# Patient Record
Sex: Male | Born: 1995 | Race: Black or African American | Hispanic: No | Marital: Single | Smoking: Never smoker
Health system: Southern US, Community
[De-identification: ages and names within clinical notes are randomized; demographics above are authoritative.]

## PROBLEM LIST (undated history)

## (undated) ENCOUNTER — Emergency Department (HOSPITAL_COMMUNITY): Discharge status: Absent without leave | Payer: Self-pay

---
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
## 2016-08-28 IMAGING — DX DG ANKLE COMPLETE 3+V*L*
3 series · 3 of 3 positions shown · non-contrast
Comparison: None.

CLINICAL DATA: Left ankle injury yesterday playing basketball,
lateral ankle pain

EXAM:
LEFT ANKLE COMPLETE - 3+ VIEW

[ankle ap]
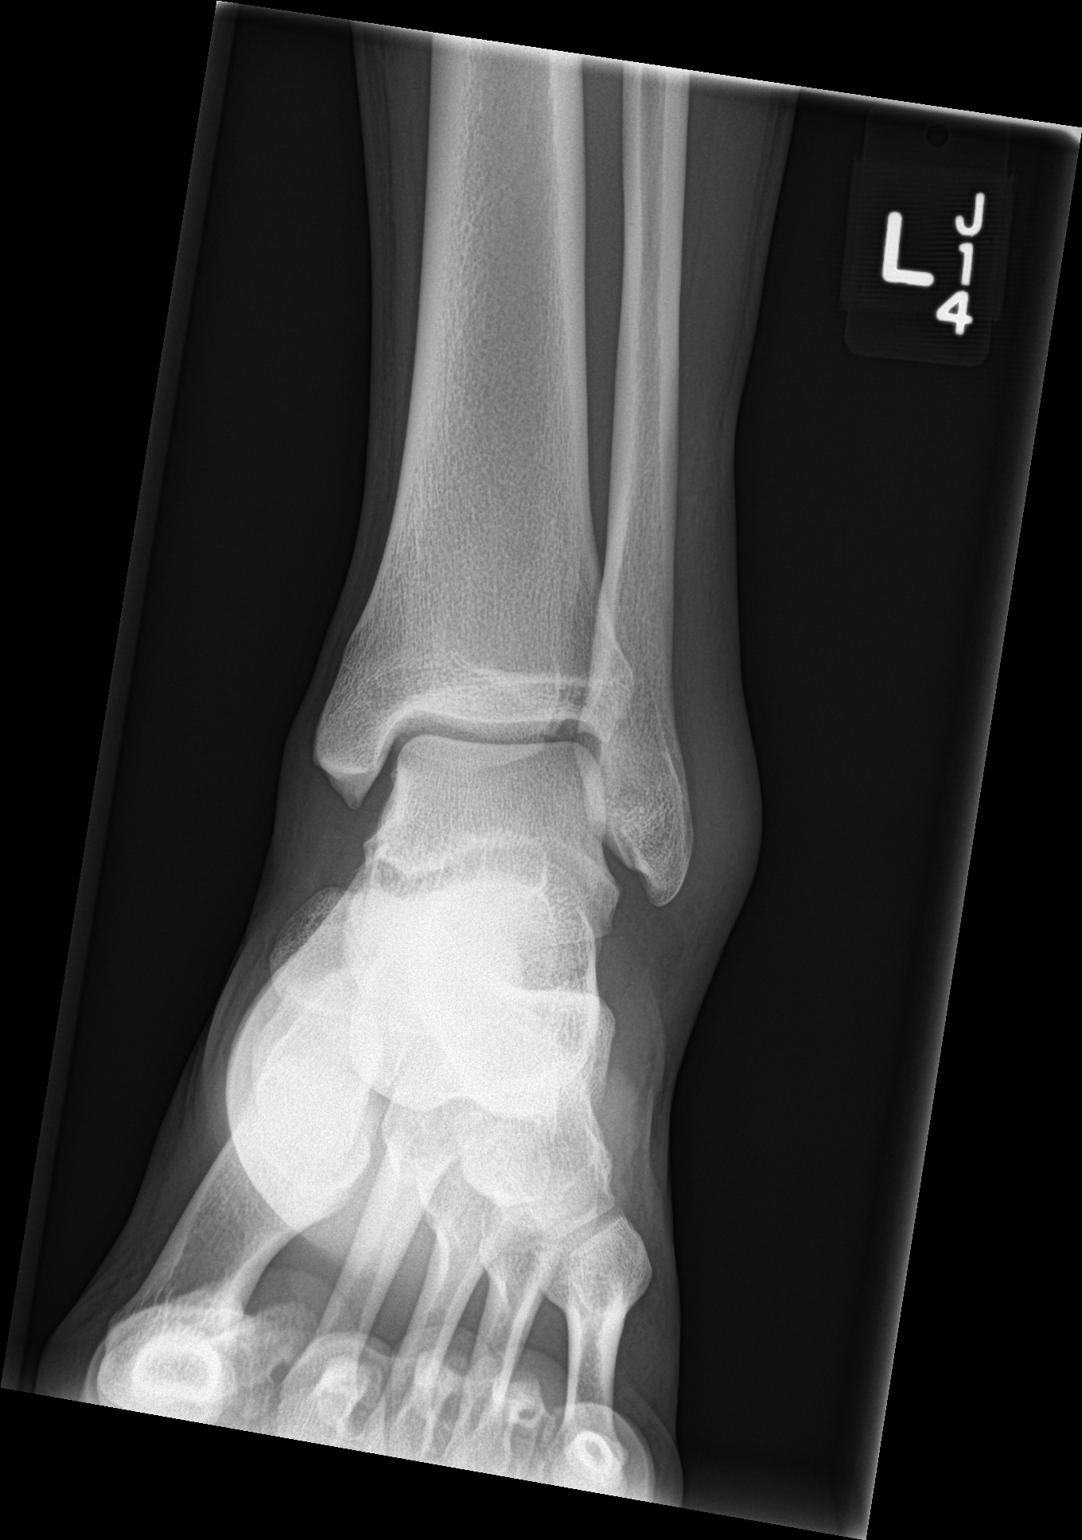

[ankle obl]
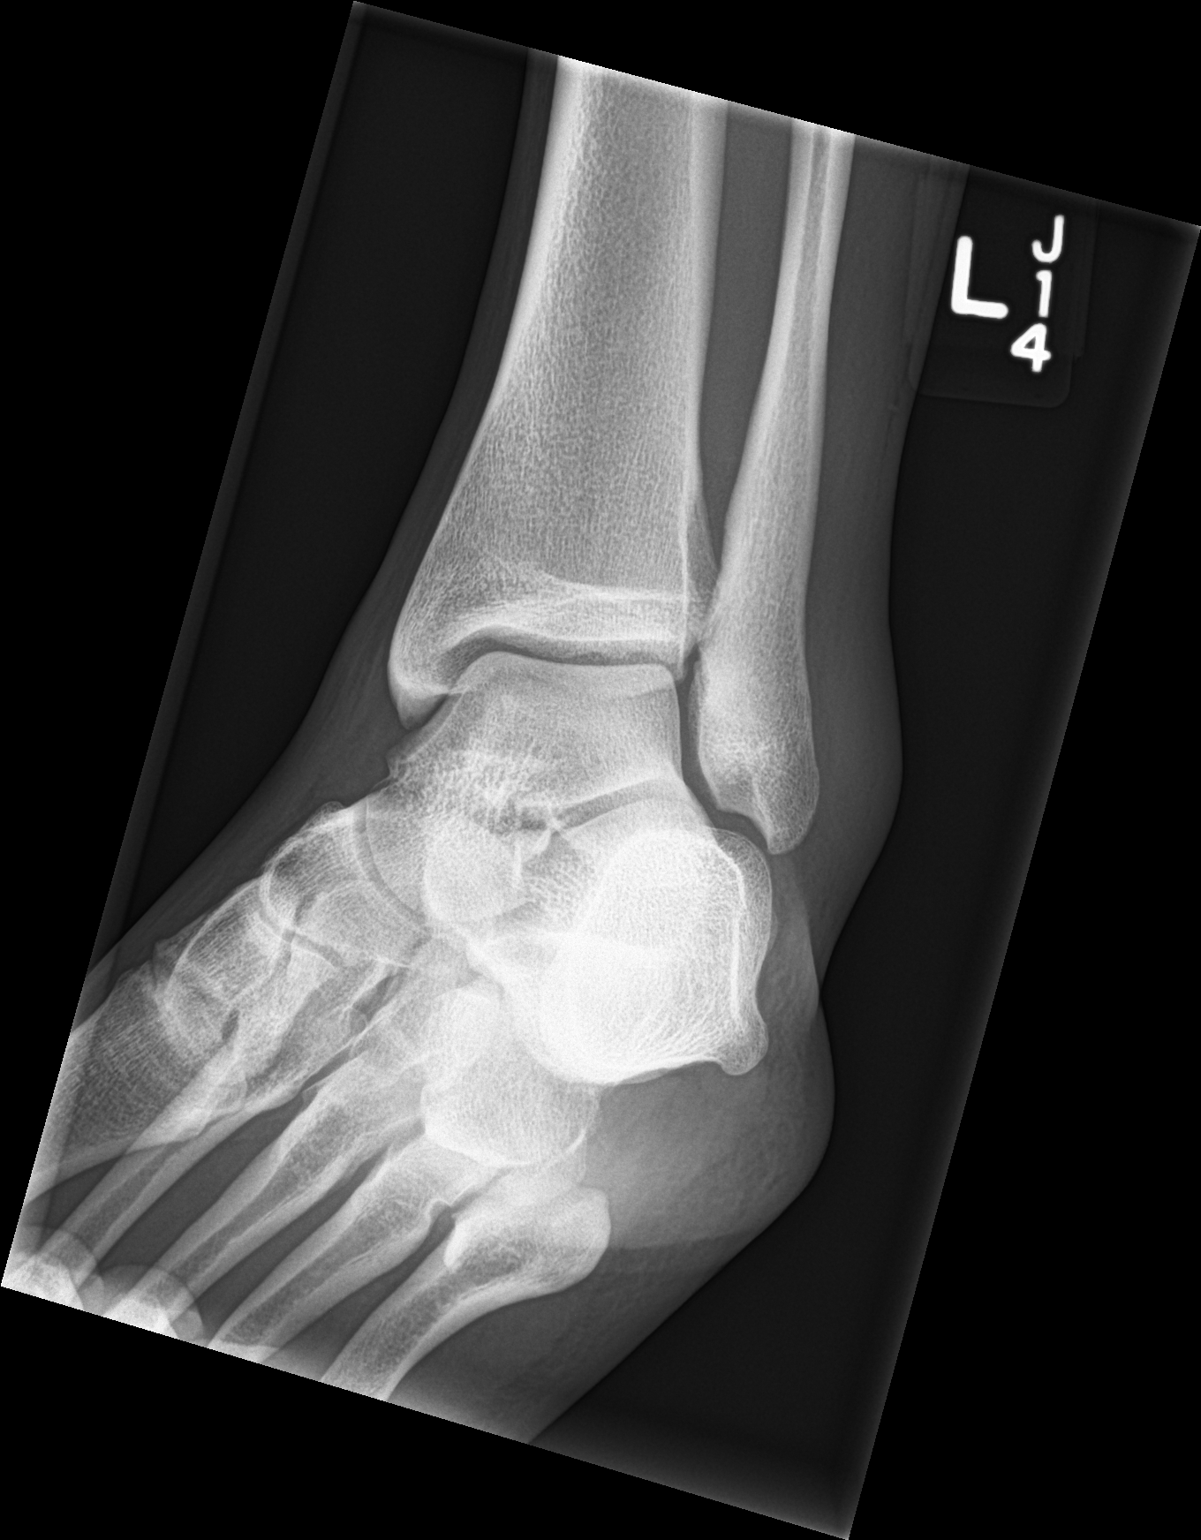

[ankle lat]
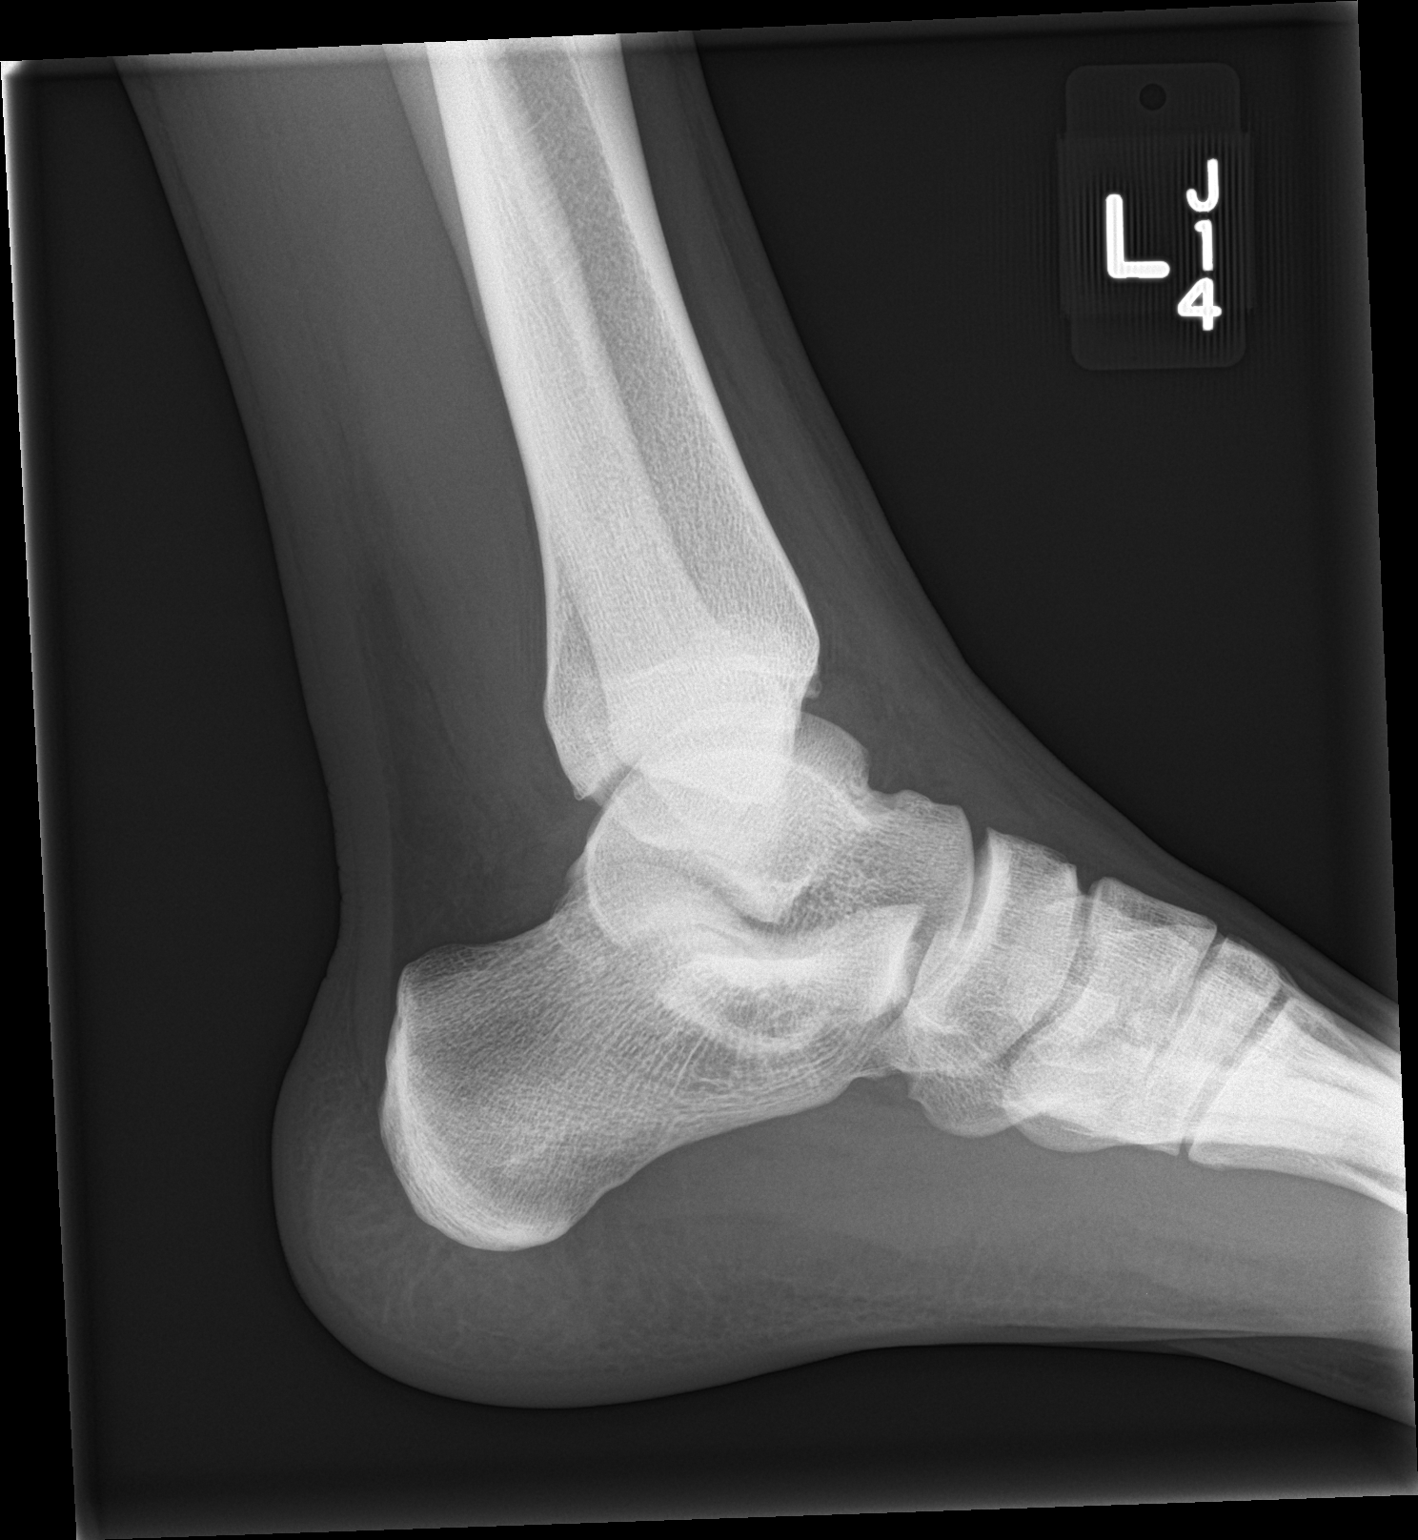

[3 of 3 positions shown; findings below may reference images not displayed]

FINDINGS: Three views of the left ankle submitted. No acute fracture or
subluxation. Ankle mortise is preserved. Soft tissue swelling
adjacent to lateral malleolus.
IMPRESSION: No acute fracture or subluxation.  Lateral soft tissue swelling.

## 2016-09-15 ENCOUNTER — Ambulatory Visit (HOSPITAL_COMMUNITY)
Admission: EM | Admit: 2016-09-15 | Discharge: 2016-09-15 | Disposition: A | Discharge status: Home / Self Care | Payer: Medicaid Other | Attending: Family Medicine | Admitting: Family Medicine

## 2016-09-15 DIAGNOSIS — H1030 Unspecified acute conjunctivitis, unspecified eye: Secondary | ICD-10-CM

## 2016-09-15 DIAGNOSIS — M7581 Other shoulder lesions, right shoulder: Secondary | ICD-10-CM | POA: Diagnosis not present

## 2016-09-15 MED ORDER — POLYMYXIN B-TRIMETHOPRIM 10000-0.1 UNIT/ML-% OP SOLN: 2.0000 [drp] | OPHTHALMIC | 0 refills | Status: AC

## 2016-09-15 MED ORDER — NAPROXEN 500 MG PO TABS: 500.0000 mg | ORAL_TABLET | Freq: Two times a day (BID) | ORAL | 0 refills | Status: DC

## 2016-09-15 NOTE — ED Provider Notes (Signed)
CSN: [id number]     Arrival date & time 09/15/16  1001 History   None    No chief complaint on file.  (Consider location/radiation/quality/duration/timing/severity/associated sxs/prior Treatment) Patient c/o right eye redness and discharge.   The history is provided by the patient.  Conjunctivitis  This is a new problem. The current episode started yesterday. The problem occurs constantly. The problem has not changed since onset.Nothing aggravates the symptoms. Nothing relieves the symptoms. He has tried nothing for the symptoms.    No past medical history on file. No past surgical history on file. No family history on file. Social History  Substance Use Topics  . Smoking status: Not on file  . Smokeless tobacco: Not on file  . Alcohol use No    Review of Systems  Constitutional: Negative.   HENT: Negative.   Eyes: Negative.   Respiratory: Negative.   Cardiovascular: Negative.   Gastrointestinal: Negative.   Endocrine: Negative.   Genitourinary: Negative.   Musculoskeletal: Negative.   Allergic/Immunologic: Negative.   Neurological: Negative.   Hematological: Negative.   Psychiatric/Behavioral: Negative.     Allergies  Patient has no known allergies.  Home Medications   Prior to Admission medications   Medication Sig Start Date End Date Taking? Authorizing Provider  ibuprofen (ADVIL,MOTRIN) 800 MG tablet Take 1 tablet (800 mg total) by mouth 3 (three) times daily. 11/05/14   Linna HoffJames D Kindl, MD  naproxen (NAPROSYN) 500 MG tablet Take 1 tablet (500 mg total) by mouth 2 (two) times daily with a meal. 09/15/16   Deatra CanterWilliam J Shawndell Varas, FNP  trimethoprim-polymyxin b (POLYTRIM) ophthalmic solution Place 2 drops into the right eye every 4 (four) hours. 09/15/16   Deatra CanterWilliam J Aviv Rota, FNP   Meds Ordered and Administered this Visit  Medications - No data to display  BP 137/75 (BP Location: Right Arm)   Pulse (!) 56   Temp 98.3 F (36.8 C) (Oral)   Resp 16   SpO2 100%  No data  found.   Physical Exam  Constitutional: He is oriented to person, place, and time. He appears well-developed and well-nourished.  HENT:  Head: Normocephalic and atraumatic.  Right Ear: External ear normal.  Left Ear: External ear normal.  Mouth/Throat: Oropharynx is clear and moist.  Eyes: Pupils are equal, round, and reactive to light.  Right eye conjunctiva erythematous and swollen  Neck: Normal range of motion. Neck supple.  Cardiovascular: Normal rate, regular rhythm and normal heart sounds.   Pulmonary/Chest: Effort normal and breath sounds normal.  Musculoskeletal: He exhibits tenderness.  Patient with FROM with active ROM right shoulder.  Tenderness with internal and external rotation and abduction.  Neurological: He is alert and oriented to person, place, and time.  Nursing note and vitals reviewed.   Urgent Care Course     Procedures (including critical care time)  Labs Review Labs Reviewed - No data to display  Imaging Review No results found.   Visual Acuity Review  Right Eye Distance:   Left Eye Distance:   Bilateral Distance:    Right Eye Near:   Left Eye Near:    Bilateral Near:         MDM   1. Right rotator cuff tendonitis   2. Acute bacterial conjunctivitis, unspecified laterality    Naprosyn 500mg  one po bid x 10 days Polytrim eye gtt's 2 gtt's OU qid #10 ml     Deatra CanterWilliam J Kassidee Narciso, FNP 09/15/16 1141

## 2016-09-22 ENCOUNTER — Encounter (HOSPITAL_COMMUNITY): Payer: Self-pay | Admitting: Emergency Medicine

## 2016-09-22 ENCOUNTER — Ambulatory Visit (HOSPITAL_COMMUNITY)
Admission: EM | Admit: 2016-09-22 | Discharge: 2016-09-22 | Disposition: A | Discharge status: Home / Self Care | Payer: Medicaid Other | Attending: Internal Medicine | Admitting: Internal Medicine

## 2016-09-22 DIAGNOSIS — H1031 Unspecified acute conjunctivitis, right eye: Secondary | ICD-10-CM

## 2016-09-22 MED ORDER — MOXIFLOXACIN HCL 0.5 % OP SOLN: 1.0000 [drp] | Freq: Three times a day (TID) | OPHTHALMIC | 0 refills | Status: AC

## 2016-09-22 NOTE — ED Provider Notes (Signed)
CSN: [id number]     Arrival date & time 09/22/16  1221 History   First MD Initiated Contact with Patient 09/22/16 1337     Chief Complaint  Patient presents with  . Eye Problem   (Consider location/radiation/quality/duration/timing/severity/associated sxs/prior Treatment) 21 year old male was seen in our urgent care 1 week ago with 2 complaints 1 related to rotator cuff rob him and the other was some redness to the right eye. The right eye was treated with Polytrim eyedrops and he states he has been compliant with that. Thigh is getting worse, the redness is worse more swelling to the lower lid conjunctiva. There is a highly thick honey-colored drainage which tends to crust over from the inner and outer corners of the eye. He denies problems with vision.      History reviewed. No pertinent past medical history. History reviewed. No pertinent surgical history. History reviewed. No pertinent family history. Social History  Substance Use Topics  . Smoking status: Not on file  . Smokeless tobacco: Not on file  . Alcohol use No    Review of Systems  Constitutional: Negative.   HENT: Negative.   Eyes: Positive for discharge, redness and itching. Negative for visual disturbance.  Respiratory: Negative.   Cardiovascular: Negative for chest pain.  All other systems reviewed and are negative.   Allergies  Patient has no known allergies.  Home Medications   Prior to Admission medications   Medication Sig Start Date End Date Taking? Authorizing Provider  trimethoprim-polymyxin b (POLYTRIM) ophthalmic solution Place 2 drops into the right eye every 4 (four) hours. 09/15/16  Yes Deatra Canter, FNP  moxifloxacin (VIGAMOX) 0.5 % ophthalmic solution Place 1 drop into the right eye 3 (three) times daily. X 7 days 09/22/16   Hayden Rasmussen, NP   Meds Ordered and Administered this Visit  Medications - No data to display  BP 145/90 (BP Location: Right Arm)   Pulse (!) 56   Temp 99.5 F (37.5  C) (Oral)   Resp 18   SpO2 97%  No data found.   Physical Exam  Constitutional: He is oriented to person, place, and time. He appears well-developed and well-nourished. No distress.  HENT:  Head: Normocephalic and atraumatic.  Eyes: EOM are normal. Pupils are equal, round, and reactive to light.  Sclera injected. Lower lid conjunctiva deeply erythema this with mild swelling. Anterior chambers clear. No tenderness or swelling of the upper lid. No palpable nodules or pustules. There is a honey colored discharge from the corners of the right. Left eye unaffected.  Neck: Normal range of motion. Neck supple.  Cardiovascular: Normal rate.   Pulmonary/Chest: Effort normal.  Neurological: He is alert and oriented to person, place, and time.  Skin: Skin is warm and dry.  Nursing note and vitals reviewed.   Urgent Care Course     Procedures (including critical care time)  Labs Review Labs Reviewed - No data to display  Imaging Review No results found.   Visual Acuity Review  Right Eye Distance: 20/25 Left Eye Distance: 20/25 Bilateral Distance: 20/20  Right Eye Near:   Left Eye Near:    Bilateral Near:         MDM   1. Acute bacterial conjunctivitis of right eye   Use the drops hysterectomy. Warm compresses as discussed. Wash hands frequently. If not getting better in 3-4 days call the ophthalmologist above for an appointment. Meds ordered this encounter  Medications  . moxifloxacin (VIGAMOX) 0.5 % ophthalmic solution  Sig: Place 1 drop into the right eye 3 (three) times daily. X 7 days    Dispense:  3 mL    Refill:  0    Order Specific Question:   Supervising Provider    Answer:   Lonia BloodLAUENSTEIN, KURT [[id number]]        Hayden Rasmussenavid Lynell Greenhouse, NP 09/22/16 1358    Hayden Rasmussenavid Quintell Bonnin, NP 09/22/16 1409

## 2016-09-22 NOTE — ED Triage Notes (Signed)
[facility] a [facility] right eye swelling and redness x 1 week. The patient reported that he was evaluated on 09/15/16 and prescribed antibiotic drops which he has used but has not improved his symptoms.

## 2016-09-22 NOTE — Discharge Instructions (Addendum)
Use the drops as directed. Warm compresses as discussed. Wash hands frequently. If not getting better in 3-4 days call the ophthalmologist above for an appointment.

## 2016-09-25 ENCOUNTER — Encounter (HOSPITAL_COMMUNITY): Payer: Self-pay | Admitting: Emergency Medicine

## 2016-09-25 ENCOUNTER — Ambulatory Visit (HOSPITAL_COMMUNITY)
Admission: EM | Admit: 2016-09-25 | Discharge: 2016-09-25 | Disposition: A | Discharge status: Home / Self Care | Payer: Medicaid Other | Attending: Emergency Medicine | Admitting: Emergency Medicine

## 2016-09-25 DIAGNOSIS — A749 Chlamydial infection, unspecified: Secondary | ICD-10-CM

## 2016-09-25 MED ORDER — AZITHROMYCIN 250 MG PO TABS
1000.0000 mg | ORAL_TABLET | Freq: Once | ORAL | Status: AC
  Administered 2016-09-25: 1000 mg via ORAL

## 2016-09-25 MED ORDER — AZITHROMYCIN 250 MG PO TABS
ORAL_TABLET | ORAL | Status: AC
  Filled 2016-09-25: qty 4

## 2016-09-25 NOTE — ED Provider Notes (Signed)
CSN: [id number]     Arrival date & time 09/25/16  1718 History   First MD Initiated Contact with Patient 09/25/16 1835     Chief Complaint  Patient presents with  . Exposure to STD   (Consider location/radiation/quality/duration/timing/severity/associated sxs/prior Treatment) 21 year old male [facility]. He was asymptomatic. He was called today and told that the only positive was for chlamydia. He is here for treatment. Again he is currently asymptomatic.      History reviewed. No pertinent past medical history. History reviewed. No pertinent surgical history. History reviewed. No pertinent family history. Social History  Substance Use Topics  . Smoking status: Never Smoker  . Smokeless tobacco: Never Used  . Alcohol use No    Review of Systems  Constitutional: Negative.   Genitourinary: Negative.   All other systems reviewed and are negative.   Allergies  Patient has no known allergies.  Home Medications   Prior to Admission medications   Medication Sig Start Date End Date Taking? Authorizing Provider  moxifloxacin (VIGAMOX) 0.5 % ophthalmic solution Place 1 drop into the right eye 3 (three) times daily. X 7 days 09/22/16   Hayden Rasmussenavid Zelpha Messing, NP  trimethoprim-polymyxin b (POLYTRIM) ophthalmic solution Place 2 drops into the right eye every 4 (four) hours. 09/15/16   Deatra CanterWilliam J Oxford, FNP   Meds Ordered and Administered this Visit   Medications  azithromycin (ZITHROMAX) tablet 1,000 mg (not administered)    BP 149/79 (BP Location: Left Arm)   Pulse (!) 54 Comment: pt states normal for him  Temp 98.3 F (36.8 C) (Oral)   Resp 12   SpO2 100%  No data found.   Physical Exam  Constitutional: He appears well-developed and well-nourished. No distress.  Eyes: EOM are normal.  Neck: Neck supple.  Cardiovascular: Normal rate.   Pulmonary/Chest: Effort normal. No respiratory distress.  Musculoskeletal: He  exhibits no edema.  Neurological: He is alert. He exhibits normal muscle tone.  Skin: No rash noted. No erythema.  Psychiatric: He has a normal mood and affect. His behavior is normal.  Nursing note and vitals reviewed.   Urgent Care Course     Procedures (including critical care time)  Labs Review Labs Reviewed - No data to display  Imaging Review No results found.   Visual Acuity Review  Right Eye Distance:   Left Eye Distance:   Bilateral Distance:    Right Eye Near:   Left Eye Near:    Bilateral Near:         MDM   1. Chlamydia infection    You are being treated for chlamydia with azithromycin 1 g. Recommend no sexual contact for 10 days. At that time he may go to the health department for a test of cure Meds ordered this encounter  Medications  . azithromycin (ZITHROMAX) tablet 1,000 mg       Hayden Rasmussenavid Keala Drum, NP 09/25/16 1842

## 2016-09-25 NOTE — Discharge Instructions (Signed)
You are being treated for chlamydia with azithromycin 1 g. Recommend no sexual contact for 10 days. At that time he may go to the health department for a test of cure.

## 2016-09-25 NOTE — ED Triage Notes (Signed)
[facility] tested [facility]lam  Was told to come here for tx.   Pt is asymptomatic   A&O x4... NAD

## 2017-05-08 ENCOUNTER — Ambulatory Visit (HOSPITAL_COMMUNITY)
Admission: EM | Admit: 2017-05-08 | Discharge: 2017-05-08 | Disposition: A | Discharge status: Home / Self Care | Payer: Self-pay | Attending: Radiology | Admitting: Radiology

## 2017-05-08 ENCOUNTER — Encounter (HOSPITAL_COMMUNITY): Payer: Self-pay | Admitting: Emergency Medicine

## 2017-05-08 DIAGNOSIS — L0291 Cutaneous abscess, unspecified: Secondary | ICD-10-CM | POA: Insufficient documentation

## 2017-05-08 DIAGNOSIS — Z79899 Other long term (current) drug therapy: Secondary | ICD-10-CM | POA: Insufficient documentation

## 2017-05-08 MED ORDER — CLINDAMYCIN HCL 150 MG PO CAPS: 300.0000 mg | ORAL_CAPSULE | Freq: Three times a day (TID) | ORAL | 0 refills | Status: AC

## 2017-05-08 NOTE — ED Triage Notes (Addendum)
Abscess at top of buttocks.  Patient noticed this area 2-3 weeks ago.  Area has become larger and more painful since onset  Patient hopeful to get it "popped"  Patient has no insurance and limited funds for purchasing medicines

## 2017-05-08 NOTE — Discharge Instructions (Signed)
Apply warm compresses to affected area and change dressing as needed. Follow up in 1 week for a recheck

## 2017-05-08 NOTE — ED Provider Notes (Addendum)
[facility]    CSN: [id number] Arrival date & time: 05/08/17  1254     History   Chief Complaint Chief Complaint  Patient presents with  . Abscess    HPI Jeffrey Schmitt is a 21 y.o. male.   21 y.o. male presents with abscess superior to and slight lateral to  the interglureral cleft X 2 weeks Condition is acute in nature. Condition is made better by  nothing. Condition is made worse by nothing. Patient denies any treatment prior to there arrival at this facility. Patient denies any fevers.        History reviewed. No pertinent past medical history.  There are no active problems to display for this patient.   History reviewed. No pertinent surgical history.     Home Medications    Prior to Admission medications   Medication Sig Start Date End Date Taking? Authorizing Provider  clindamycin (CLEOCIN) 150 MG capsule Take 2 capsules (300 mg total) by mouth 3 (three) times daily. 05/08/17   Alene Miresmohundro, Jennifer C, NP  moxifloxacin (VIGAMOX) 0.5 % ophthalmic solution Place 1 drop into the right eye 3 (three) times daily. X 7 days 09/22/16   Hayden RasmussenMabe, David, NP  trimethoprim-polymyxin b (POLYTRIM) ophthalmic solution Place 2 drops into the right eye every 4 (four) hours. 09/15/16   Deatra Canterxford, William J, FNP    Family History No family history on file.  Social History Social History  Substance Use Topics  . Smoking status: Never Smoker  . Smokeless tobacco: Never Used  . Alcohol use No     Allergies   Patient has no known allergies.   Review of Systems Review of Systems  Constitutional: Negative for chills and fever.  HENT: Negative for ear pain and sore throat.   Eyes: Negative for pain and visual disturbance.  Respiratory: Negative for cough and shortness of breath.   Cardiovascular: Negative for chest pain and palpitations.  Gastrointestinal: Negative for abdominal pain and vomiting.  Genitourinary: Negative for dysuria and hematuria.    Musculoskeletal: Negative for arthralgias and back pain.  Skin: Negative for color change and rash.       Abscess to buttock  Neurological: Negative for seizures and syncope.  All other systems reviewed and are negative.    Physical Exam Triage Vital Signs ED Triage Vitals  Enc Vitals Group     BP 05/08/17 1353 126/64     Pulse Rate 05/08/17 1353 (!) 46     Resp 05/08/17 1353 18     Temp 05/08/17 1353 98 F (36.7 C)     Temp Source 05/08/17 1353 Oral     SpO2 05/08/17 1353 99 %     Weight --      Height --      Head Circumference --      Peak Flow --      Pain Score 05/08/17 1351 4     Pain Loc --      Pain Edu? --      Excl. in GC? --    No data found.   Updated Vital Signs BP 126/64 (BP Location: Right Arm) Comment: large cuff  Pulse (!) 46   Temp 98 F (36.7 C) (Oral)   Resp 18   SpO2 99%   Visual Acuity Right Eye Distance:   Left Eye Distance:   Bilateral Distance:    Right Eye Near:   Left Eye Near:    Bilateral Near:     Physical Exam  Constitutional: He is oriented to person, place, and time. He appears well-developed and well-nourished.  HENT:  Head: Normocephalic.  Neck: Normal range of motion.  Pulmonary/Chest: Effort normal.  Musculoskeletal: Normal range of motion.  Neurological: He is alert and oriented to person, place, and time.  Skin: Skin is dry.  Abscess left and superior to intergluteal cleft. approximately 3.5 cm in diameters.   Psychiatric: He has a normal mood and affect.  Nursing note and vitals reviewed.    UC Treatments / Results  Labs (all labs ordered are listed, but only abnormal results are displayed) Labs Reviewed  AEROBIC CULTURE (SUPERFICIAL SPECIMEN)    EKG  EKG Interpretation None       Radiology No results found.  Procedures Procedures (including critical care time)  Medications Ordered in UC Medications - No data to display   Initial Impression / Assessment and Plan / UC Course  I have  reviewed the triage vital signs and the nursing notes.  Pertinent labs & imaging results that were available during my care of the patient were reviewed by me and considered in my medical decision making (see chart for details).       Final Clinical Impressions(s) / UC Diagnoses   Final diagnoses:  Abscess    New Prescriptions Discharge Medication List as of 05/08/2017  2:17 PM    START taking these medications   Details  clindamycin (CLEOCIN) 150 MG capsule Take 2 capsules (300 mg total) by mouth 3 (three) times daily., Starting Sat 05/08/2017, Normal         Controlled Substance Prescriptions [place] Controlled Substance Registry consulted? Not Applicable   Alene Mires, NP 05/08/17 1414    Alene Mires, NP 05/08/17 1517

## 2017-05-12 LAB — AEROBIC CULTURE W GRAM STAIN (SUPERFICIAL SPECIMEN): Culture: NORMAL

## 2017-05-12 LAB — AEROBIC CULTURE  (SUPERFICIAL SPECIMEN)
